# Patient Record
Sex: Female | Born: 1945 | Race: Black or African American | Hispanic: No | Smoking: Current every day smoker
Health system: Southern US, Community
[De-identification: ages and names within clinical notes are randomized; demographics above are authoritative.]

## PROBLEM LIST (undated history)

## (undated) DIAGNOSIS — G130 Paraneoplastic neuromyopathy and neuropathy: Secondary | ICD-10-CM

## (undated) DIAGNOSIS — J431 Panlobular emphysema: Secondary | ICD-10-CM

## (undated) DIAGNOSIS — C3490 Malignant neoplasm of unspecified part of unspecified bronchus or lung: Secondary | ICD-10-CM

## (undated) DIAGNOSIS — T7840XA Allergy, unspecified, initial encounter: Secondary | ICD-10-CM

## (undated) DIAGNOSIS — I10 Essential (primary) hypertension: Secondary | ICD-10-CM

## (undated) DIAGNOSIS — K219 Gastro-esophageal reflux disease without esophagitis: Secondary | ICD-10-CM

## (undated) DIAGNOSIS — G709 Myoneural disorder, unspecified: Secondary | ICD-10-CM

## (undated) DIAGNOSIS — D499 Neoplasm of unspecified behavior of unspecified site: Secondary | ICD-10-CM

---

## 2016-01-11 ENCOUNTER — Other Ambulatory Visit (HOSPITAL_COMMUNITY): Payer: Self-pay | Admitting: Internal Medicine

## 2016-01-11 DIAGNOSIS — R918 Other nonspecific abnormal finding of lung field: Secondary | ICD-10-CM

## 2016-01-13 ENCOUNTER — Ambulatory Visit (HOSPITAL_COMMUNITY): Payer: Medicare Other

## 2016-01-20 ENCOUNTER — Ambulatory Visit (HOSPITAL_COMMUNITY)
Admission: RE | Admit: 2016-01-20 | Discharge: 2016-01-20 | Disposition: A | Discharge status: Home / Self Care | Payer: Medicare Other | Source: Ambulatory Visit | Attending: Internal Medicine | Admitting: Internal Medicine

## 2016-01-20 DIAGNOSIS — R918 Other nonspecific abnormal finding of lung field: Secondary | ICD-10-CM

## 2016-01-20 DIAGNOSIS — R590 Localized enlarged lymph nodes: Secondary | ICD-10-CM | POA: Diagnosis not present

## 2016-01-20 DIAGNOSIS — C3411 Malignant neoplasm of upper lobe, right bronchus or lung: Secondary | ICD-10-CM | POA: Insufficient documentation

## 2016-01-20 LAB — GLUCOSE, CAPILLARY: Glucose-Capillary: 103 mg/dL — ABNORMAL HIGH (ref 65–99)

## 2016-01-20 MED ORDER — FLUDEOXYGLUCOSE F - 18 (FDG) INJECTION
6.7800 | Freq: Once | INTRAVENOUS | Status: AC | PRN
Start: 2016-01-20 — End: 2016-01-20
  Administered 2016-01-20: 6.78 via INTRAVENOUS

## 2016-02-10 ENCOUNTER — Encounter: Payer: Self-pay | Admitting: *Deleted

## 2016-02-10 NOTE — Progress Notes (Signed)
Oncology Nurse Navigator Documentation  Oncology Nurse Navigator Flowsheets 02/10/2016  Navigator Encounter Type Other  Acuity Level 2  Time Spent with Patient 30   I updated Shona Simpson, PA-C on updates from [employer]  on 02/09/16.  Patient receiving treatment at [facility] per care ever where.

## 2016-02-22 ENCOUNTER — Encounter: Payer: Self-pay | Admitting: Radiation Therapy

## 2016-02-22 ENCOUNTER — Other Ambulatory Visit: Payer: Self-pay | Admitting: Radiation Therapy

## 2016-02-22 DIAGNOSIS — C7949 Secondary malignant neoplasm of other parts of nervous system: Principal | ICD-10-CM

## 2016-02-22 DIAGNOSIS — C7931 Secondary malignant neoplasm of brain: Secondary | ICD-10-CM

## 2016-02-22 NOTE — Progress Notes (Signed)
1.  Do you need a wheel chair?    no  2. On oxygen? no  3. Have you ever had any surgery in the body part being scanned?  no  4. Have you ever had any surgery on your brain or heart?  no                                  5. Have you ever had surgery on your eyes or ears?   no                                6. Do you have a pacemaker or defibrillator?   no  7. Do you have a Neurostimulator?  no 8. Claustrophobic?  no  9. Any risk for metal in eyes?  no  10. Injury by bullet, buckshot, or shrapnel?  no  11. Stent?     no                                                                              12. Hx of Cancer?   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 Kidney or Liver disease?  no  14. Hx of Lupus, Rheumatoid Arthritis or Scleroderma?  no  15. IV Antibiotics or long term use of NSAIDS?  no  16. HX of Hypertension?  yes  17. Diabetes?  no  18. Allergy to contrast?  no  19. Recent labs.  Labs to be drawn at GI prior to her scan   Screening questions answered by pt over the phone on 02/22/16    Susan Boyles  R.T.(R)(T) Special Procedures Navigator [phone]     

## 2016-02-23 ENCOUNTER — Telehealth: Payer: Self-pay | Admitting: *Deleted

## 2016-02-23 NOTE — Telephone Encounter (Signed)
Called the [place] office, left vm on brittany RN phone requesting most recent labs of the patient BUN/CR levels, she is here Monday for IV start, and in Epic labs are out of date, please fax or get new orders from Dr. Lisbeth Renshaw for patient to get these done before Monday please.,  3:07 PM

## 2016-02-24 ENCOUNTER — Inpatient Hospital Stay: Admission: RE | Admit: 2016-02-24 | Payer: Medicare Other | Source: Ambulatory Visit

## 2016-02-27 ENCOUNTER — Ambulatory Visit: Admission: RE | Admit: 2016-02-27 | Payer: Medicare Other | Source: Ambulatory Visit | Admitting: Radiation Oncology

## 2016-02-27 ENCOUNTER — Ambulatory Visit
Admission: RE | Admit: 2016-02-27 | Discharge: 2016-02-27 | Disposition: A | Discharge status: Home / Self Care | Payer: Medicare Other | Source: Ambulatory Visit | Attending: Radiation Oncology | Admitting: Radiation Oncology

## 2016-02-27 ENCOUNTER — Other Ambulatory Visit: Payer: Self-pay | Admitting: Radiation Oncology

## 2016-02-27 ENCOUNTER — Encounter: Payer: Self-pay | Admitting: Radiation Oncology

## 2016-02-27 ENCOUNTER — Telehealth: Payer: Self-pay | Admitting: *Deleted

## 2016-02-27 VITALS — BP 119/64 | HR 81 | Temp 98.6°F | Resp 16

## 2016-02-27 DIAGNOSIS — Z01812 Encounter for preprocedural laboratory examination: Secondary | ICD-10-CM

## 2016-02-27 DIAGNOSIS — Z510 Encounter for antineoplastic radiation therapy: Secondary | ICD-10-CM | POA: Diagnosis present

## 2016-02-27 DIAGNOSIS — C7931 Secondary malignant neoplasm of brain: Secondary | ICD-10-CM | POA: Diagnosis present

## 2016-02-27 DIAGNOSIS — C801 Malignant (primary) neoplasm, unspecified: Secondary | ICD-10-CM | POA: Diagnosis not present

## 2016-02-27 DIAGNOSIS — C7949 Secondary malignant neoplasm of other parts of nervous system: Secondary | ICD-10-CM

## 2016-02-27 HISTORY — DX: Malignant neoplasm of unspecified part of unspecified bronchus or lung: C34.90

## 2016-02-27 HISTORY — DX: Paraneoplastic neuromyopathy and neuropathy: G13.0

## 2016-02-27 HISTORY — DX: Gastro-esophageal reflux disease without esophagitis: K21.9

## 2016-02-27 HISTORY — DX: Essential (primary) hypertension: I10

## 2016-02-27 HISTORY — DX: Myoneural disorder, unspecified: G70.9

## 2016-02-27 HISTORY — DX: Neoplasm of unspecified behavior of unspecified site: D49.9

## 2016-02-27 HISTORY — DX: Allergy, unspecified, initial encounter: T78.40XA

## 2016-02-27 HISTORY — DX: Panlobular emphysema: J43.1

## 2016-02-27 LAB — BASIC METABOLIC PANEL
Anion Gap: 13 mEq/L — ABNORMAL HIGH (ref 3–11)
BUN: 18.5 mg/dL (ref 7.0–26.0)
CHLORIDE: 99 meq/L (ref 98–109)
CO2: 25 meq/L (ref 22–29)
Calcium: 9.7 mg/dL (ref 8.4–10.4)
Creatinine: 0.8 mg/dL (ref 0.6–1.1)
EGFR: 89 mL/min/{1.73_m2} — AB (ref >90)
Glucose: 132 mg/dl (ref 70–140)
POTASSIUM: 4.5 meq/L (ref 3.5–5.1)
SODIUM: 137 meq/L (ref 136–145)

## 2016-02-27 MED ORDER — SODIUM CHLORIDE 0.9% FLUSH
10.0000 mL | Freq: Once | INTRAVENOUS | Status: AC
  Administered 2016-02-27: 10 mL via INTRAVENOUS

## 2016-02-27 MED ORDER — HEPARIN SOD (PORK) LOCK FLUSH 100 UNIT/ML IV SOLN
500.0000 [IU] | Freq: Once | INTRAVENOUS | Status: AC
  Administered 2016-02-27: 500 [IU] via INTRAVENOUS

## 2016-02-27 NOTE — Telephone Encounter (Signed)
Called the patient to come early and be here at noon for lab work since she didn't show Friday in [place], patient gave verbal understanding to be here at noon to check in for labs first before Radiation appt 0930am  Sister Dow Adolph called back confirming new time and gave explanation why patient needed to be here today at noon for lab we need results before can start IV  For Ct simulation 9:42 AM

## 2016-02-27 NOTE — Progress Notes (Addendum)
Does patient have an allergy to IV contrast dye?: NO   Has patient ever received premedication for IV contrast dye?: no  Does patient take metformin?: NO  If patient does take metformin when was the last dose: NO, not diabetic Date of lab work: 02/27/16 '@1122am'$  BUN: 18.5 CR: 0.8  IV site: iv locations: Right power port, placed emla cream over site 1255 will wait 20 minutes to access Patient gave name and dob as identification no c/o pain, coughs up sputum occasionally,radiation to lung today in [place] Accessed right power port using sterile technique,   98.6

## 2016-02-27 NOTE — Progress Notes (Signed)
Does patient have an allergy to IV contrast dye?: NO   Has patient ever received premedication for IV contrast dye?: NO  Does patient take metformin?: NO  If patient does take metformin when was the last dose: NO, not diabetic Date of lab work: 02/27/16 BUN: 18.5 CR: 0.8  IV site: iv locations: Right power port   de accessed patient power port right subclavian after flushing with 110m normal saline and 500/units/ml  Of heparin , still had excellent blood return, bandaid applied over site, patient tolerated well no c/o pain Waiting on Dr. SLauree Chandlerto see patient

## 2016-03-02 ENCOUNTER — Ambulatory Visit
Admission: RE | Admit: 2016-03-02 | Discharge: 2016-03-02 | Disposition: A | Discharge status: Home / Self Care | Payer: Medicare Other | Source: Ambulatory Visit | Attending: Radiation Oncology | Admitting: Radiation Oncology

## 2016-03-02 DIAGNOSIS — C7931 Secondary malignant neoplasm of brain: Secondary | ICD-10-CM

## 2016-03-02 DIAGNOSIS — C7949 Secondary malignant neoplasm of other parts of nervous system: Principal | ICD-10-CM

## 2016-03-02 DIAGNOSIS — Z510 Encounter for antineoplastic radiation therapy: Secondary | ICD-10-CM | POA: Diagnosis not present

## 2016-03-02 MED ORDER — GADOBENATE DIMEGLUMINE 529 MG/ML IV SOLN
10.0000 mL | Freq: Once | INTRAVENOUS | Status: AC | PRN
  Administered 2016-03-02: 10 mL via INTRAVENOUS

## 2016-03-07 ENCOUNTER — Encounter: Payer: Self-pay | Admitting: Radiation Oncology

## 2016-03-07 ENCOUNTER — Ambulatory Visit
Admission: RE | Admit: 2016-03-07 | Discharge: 2016-03-07 | Disposition: A | Discharge status: Home / Self Care | Payer: Medicare Other | Source: Ambulatory Visit | Attending: Radiation Oncology | Admitting: Radiation Oncology

## 2016-03-07 VITALS — BP 121/50 | HR 68 | Temp 98.0°F | Resp 16

## 2016-03-07 DIAGNOSIS — C7931 Secondary malignant neoplasm of brain: Secondary | ICD-10-CM | POA: Insufficient documentation

## 2016-03-07 DIAGNOSIS — Z510 Encounter for antineoplastic radiation therapy: Secondary | ICD-10-CM | POA: Diagnosis not present

## 2016-03-07 NOTE — Progress Notes (Signed)
S/p SRS Brain, patient i no c/o pain, no nausea, dizzy ness or vision changes, eating subway sub, continue to monitor patient 30 minutes , can be d/c home at 220pm, daughter at bedside, gave instructions no driving today , call for any unusual symptoms not normal for you once home, to rest and cll for any concerns, verbal understanding 2:07 PM

## 2016-03-07 NOTE — Progress Notes (Signed)
  Radiation Oncology         (336) [phone] ________________________________  Name: Vanessa Park MRN: [id number]  Date: 03/07/2016  DOB: 31-Mar-1946   SPECIAL TREATMENT PROCEDURE   3D TREATMENT PLANNING AND DOSIMETRY: The patient's radiation plan was reviewed and approved by Dr. Vertell Limber from neurosurgery and radiation oncology prior to treatment. It showed 3-dimensional radiation distributions overlaid onto the planning CT/MRI image set. The [facility] for the target structures as well as the organs at risk were reviewed. The documentation of the 3D plan and dosimetry are filed in the radiation oncology EMR.   NARRATIVE: The patient was brought to the TrueBeam stereotactic radiation treatment machine and placed supine on the CT couch. The head frame was applied, and the patient was set up for stereotactic radiosurgery. Neurosurgery was present for the set-up and delivery   SIMULATION VERIFICATION: In the couch zero-angle position, the patient underwent Exactrac imaging using the Brainlab system with orthogonal KV images. These were carefully aligned and repeated to confirm treatment position for each of the isocenters. The Exactrac snap film verification was repeated at each couch angle.   SPECIAL TREATMENT PROCEDURE: The patient received stereotactic radiosurgery to the following target:  PTV Rt Putamen 61m target was treated using 3 Arcs to a prescription dose of 20 Gy. ExacTrac Snap verification was performed for each couch angle.   STEREOTACTIC TREATMENT MANAGEMENT: Following delivery, the patient was transported to nursing in stable condition and monitored for possible acute effects. Vital signs were recorded . The patient tolerated treatment without significant acute effects, and was discharged to home in stable condition.  PLAN: Follow-up in one month.   ------------------------------------------------  JJodelle Gross MD, PhD

## 2016-03-07 NOTE — Progress Notes (Signed)
  Radiation Oncology         [phone] ________________________________  Name: Vanessa Park MRN: [id number]  Date: 02/27/2016  DOB: [birth year]  DIAGNOSIS:     ICD-9-CM ICD-10-CM   1. 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  Identity was confirmed.  All relevant records and images related to the planned course of therapy were reviewed.  The patient freely provided informed written consent to proceed with treatment after reviewing the details related to the planned course of therapy. The consent form was witnessed and verified by the simulation staff. Intravenous access was established for contrast administration. Then, the patient was set-up in a stable reproducible supine position for radiation therapy.  A relocatable thermoplastic stereotactic head frame was fabricated for precise immobilization.  CT images were obtained.  Surface markings were placed.  The CT images were loaded into the planning software and fused with the patient's targeting MRI scan.  Then the target and avoidance structures were contoured.  Treatment planning then occurred.  The radiation prescription was entered and confirmed.  I have requested 3D planning  I have requested a DVH of the following structures: Brain stem, brain, left eye, right eye, lenses, optic chiasm, target volumes, uninvolved brain, and normal tissue.    SPECIAL TREATMENT PROCEDURE:  The planned course of therapy using radiation constitutes a special treatment procedure. Special care is required in the management of this patient for the following reasons. This treatment constitutes a Special Treatment Procedure for the following reason: High dose per fraction requiring special monitoring for increased toxicities of treatment including daily imaging.  The special nature of the planned course of radiotherapy will require increased physician supervision and oversight to ensure patient's  safety with optimal treatment outcomes.  PLAN:  The patient will receive 20 Gy in 1 fraction.   ------------------------------------------------  Jodelle Gross, MD, PhD

## 2016-03-07 NOTE — Progress Notes (Signed)
  Radiation Oncology         (336) [phone] ________________________________  Name: Vanessa Park MRN: [id number]  Date: 03/07/2016  DOB: 14-Aug-1946  End of Treatment Note  Diagnosis:      ICD-9-CM ICD-10-CM   1. 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 ExacTrac Snap verification was performed for each couch angle.   Narrative: The patient tolerated radiation treatment well.   There were no signs of acute toxicity after treatment.  Plan: The patient has completed radiation treatment. The patient will return to radiation oncology clinic for routine followup in one month. I advised the patient to call or return sooner if they have any questions or concerns related to their recovery or treatment. ________________________________  ------------------------------------------------  JJodelle Gross MD, PhD

## 2016-03-13 NOTE — Op Note (Signed)
  Name: Vanessa Park  MRN: [id number]  Date: 03/02/2016   DOB: 1946/09/23  Stereotactic Radiosurgery Operative Note  PRE-OPERATIVE DIAGNOSIS:  Solitary Brain Metastasis  POST-OPERATIVE DIAGNOSIS:  Solitary Brain Metastasis  PROCEDURE:  Stereotactic Radiosurgery  SURGEON:  Peggyann Shoals, MD  NARRATIVE: The patient underwent a radiation treatment planning session in the radiation oncology simulation suite under the care of the radiation oncology physician and physicist.  I participated closely in the radiation treatment planning afterwards. The patient underwent planning CT which was fused to 3T high resolution MRI with 1 mm axial slices.  These images were fused on the planning system.  We contoured the gross target volumes and subsequently expanded this to yield the Planning Target Volume. I actively participated in the planning process.  I helped to define and review the target contours and also the contours of the optic pathway, eyes, brainstem and selected nearby organs at risk.  All the dose constraints for critical structures were reviewed and compared to AAPM Task Group 101.  The prescription dose conformity was reviewed.  I approved the plan electronically.    Accordingly, Vanessa Park was brought to the TrueBeam stereotactic radiation treatment linac and placed in the custom immobilization mask.  The patient was aligned according to the IR fiducial markers with BrainLab Exactrac, then orthogonal x-rays were used in ExacTrac with the 6DOF robotic table and the shifts were made to align the patient  Vanessa Park received stereotactic radiosurgery uneventfully.    The detailed description of the procedure is recorded in the radiation oncology procedure note.  I was present for the duration of the procedure.  DISPOSITION:  Following delivery, the patient was transported to nursing in stable condition and monitored for possible acute effects to be discharged to home in stable condition with  follow-up in one month.  Peggyann Shoals, MD 03/13/2016 12:59 PM

## 2016-07-20 ENCOUNTER — Encounter: Payer: Self-pay | Admitting: Radiation Therapy

## 2016-07-20 NOTE — Progress Notes (Signed)
Called to check in on patient. Her daughter answered the phone and  let me know that Jolan  passed away on 08/03/16.  Mont Dutton R.T.(R)(T)

## 2016-08-08 DEATH — deceased

## 2017-05-15 IMAGING — MR MR HEAD WO/W CM
11 series · 48 of 48 positions shown · IV contrast (multihance)
Comparison: 01/18/2016

CLINICAL DATA: Recently diagnosed lung cancer with right putamen
metastasis on prior routine brain MRI. SRS targeting.

EXAM:
MRI HEAD WITHOUT AND WITH CONTRAST
TECHNIQUE: Multiplanar, multiecho pulse sequences of the brain and surrounding
structures were obtained without and with intravenous contrast.
CONTRAST:  10mL MULTIHANCE GADOBENATE DIMEGLUMINE 529 MG/ML IV SOLN

[Series 2: FLAIR · sagittal · 3.0mm · 0.75mm/px · 3 of 40 slices shown (1 of 2)]
[im 1/40]
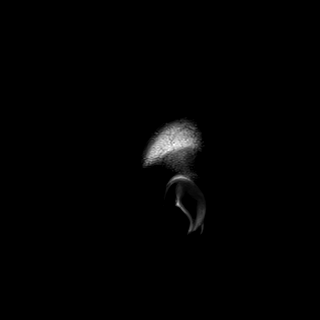
[im 20/40]
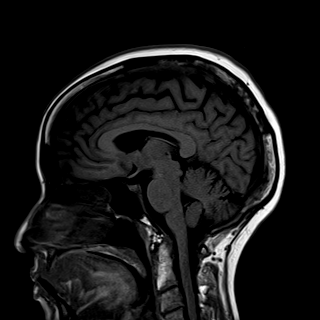
[im 40/40]
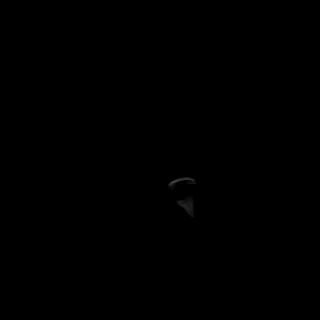

[Series 3: DWI · axial · 3.0mm · 1.50mm/px · z∈[-67,+81]mm · 5 of 78 slices shown (1 of 2)]
[im 1/78]
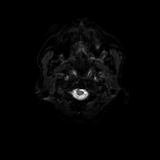
[im 20/78]
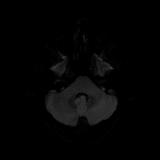
[im 39/78]
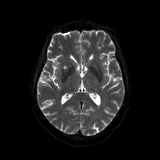
[im 58/78]
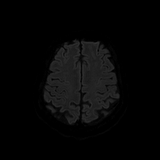
[im 78/78]
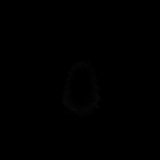

[Series 4: DWI · axial · 3.0mm · 1.50mm/px · z∈[-67,+81]mm · 3 of 38 slices shown (2 of 2)]
[im 1/38]
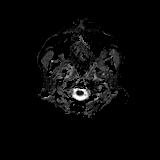
[im 19/38]
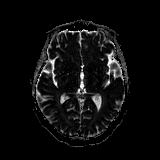
[im 38/38]
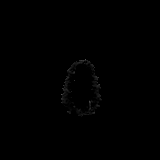

[Series 5: T2 · axial · 5.0mm · 0.57mm/px · z∈[-71,+85]mm · 2 of 27 slices shown]
[im 1/27]
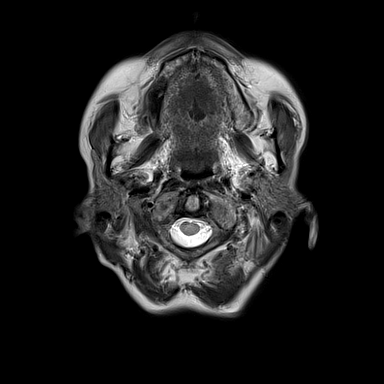
[im 27/27]
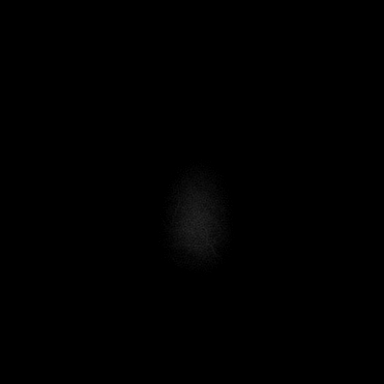

[Series 6: GRE · axial · 5.0mm · 0.57mm/px · z∈[-71,+85]mm · 2 of 27 slices shown]
[im 1/27]
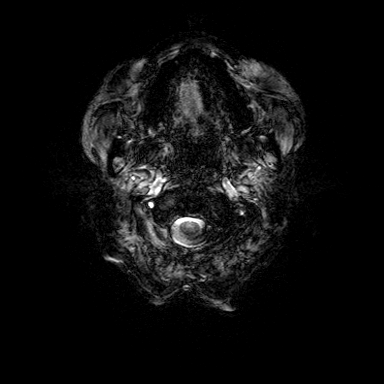
[im 27/27]
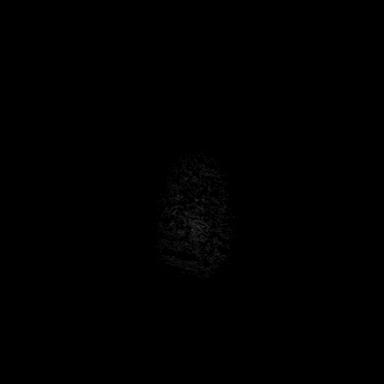

[Series 7: FLAIR · axial · 5.0mm · 0.57mm/px · z∈[-64,+92]mm · 2 of 27 slices shown (2 of 2)]
[im 1/27]
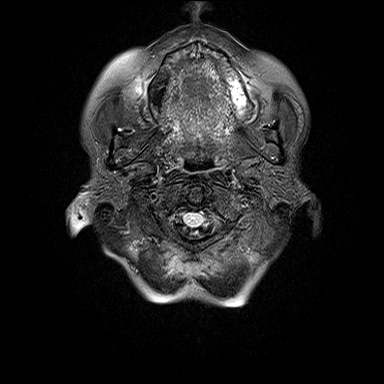
[im 27/27]
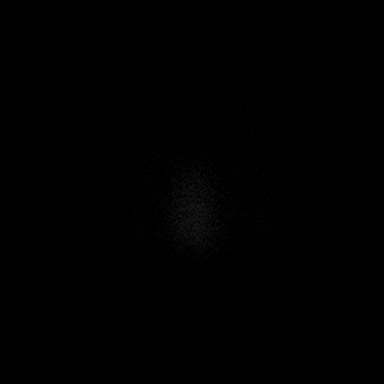

[Series 8: T1 · axial · 1.0mm · 0.75mm/px · z∈[-66,+92]mm · 11 of 159 slices shown]
[im 1/159]
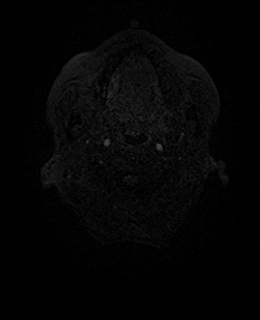
[im 16/159]
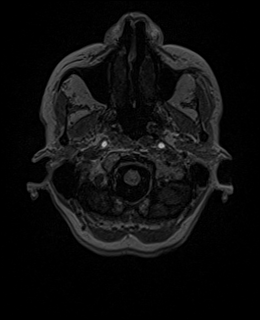
[im 32/159]
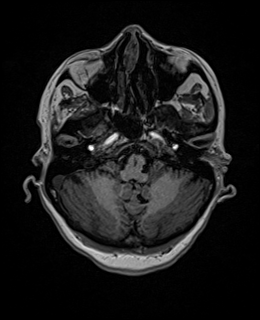
[im 48/159]
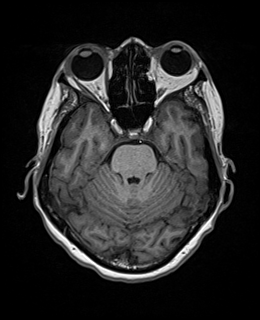
[im 64/159]
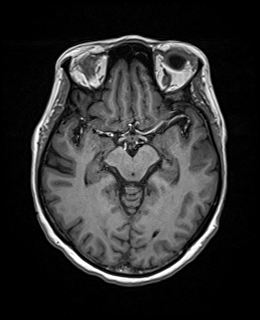
[im 80/159]
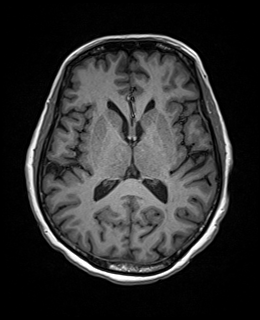
[im 95/159]
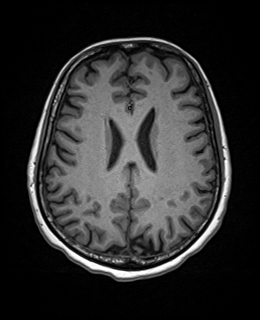
[im 111/159]
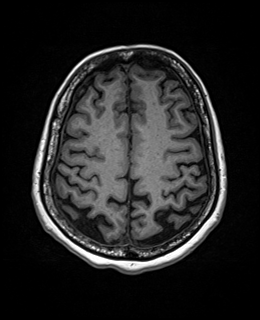
[im 127/159]
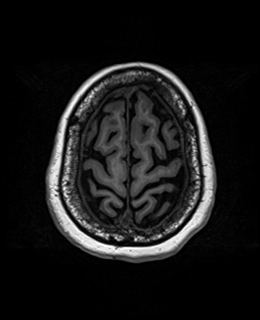
[im 143/159]
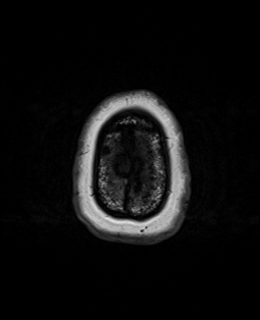
[im 159/159]
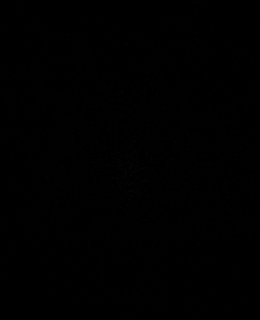

[Series 9: T2 post-contrast · coronal · 3.0mm · 0.57mm/px · 3 of 50 slices shown]
[im 1/50]
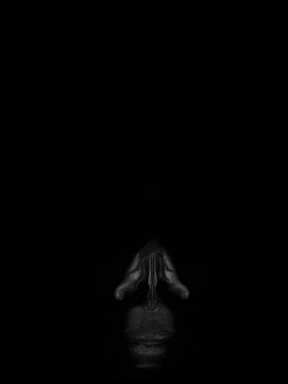
[im 25/50]
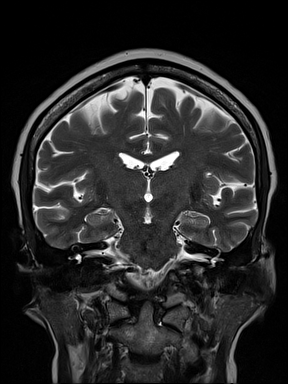
[im 50/50]
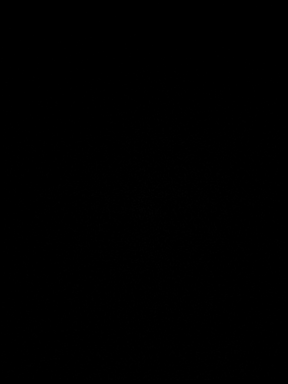

[Series 10: T1 post-contrast · axial · 1.0mm · 0.75mm/px · z∈[-66,+93]mm · 11 of 160 slices shown (1 of 2)]
[im 1/160]
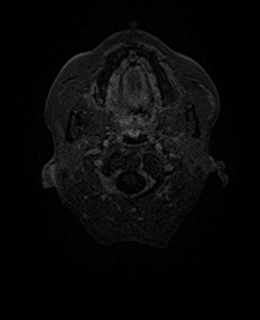
[im 16/160]
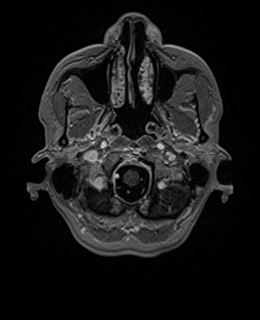
[im 32/160]
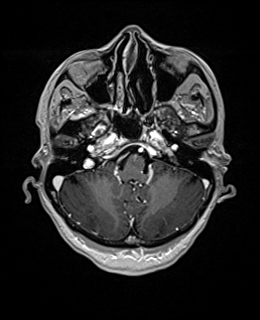
[im 48/160]
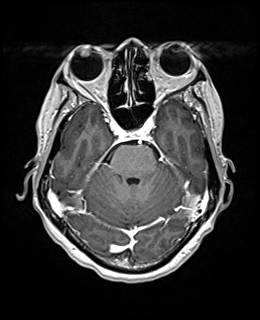
[im 64/160]
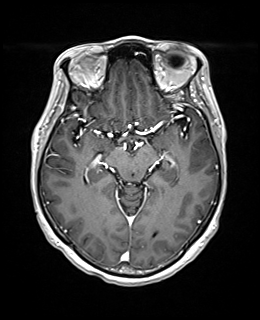
[im 80/160]
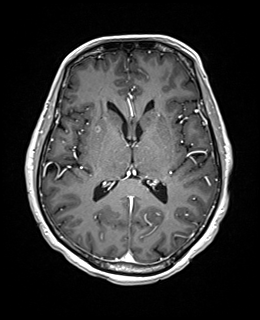
[im 96/160]
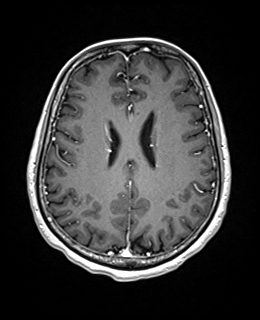
[im 112/160]
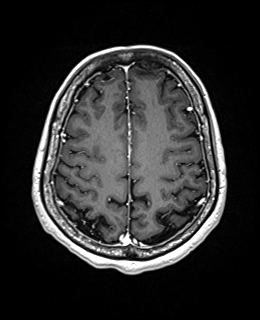
[im 128/160]
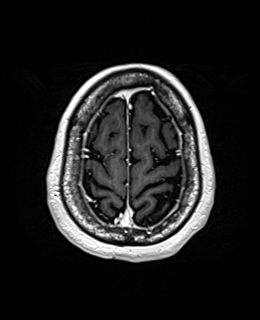
[im 144/160]
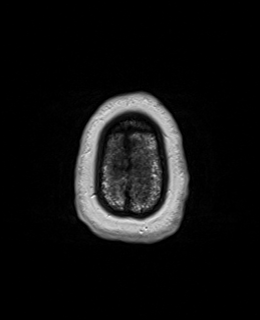
[im 160/160]
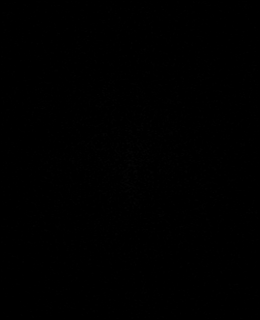

[Series 11: T1 post-contrast · coronal · 3.0mm · 0.57mm/px · 3 of 50 slices shown (2 of 2)]
[im 1/50]
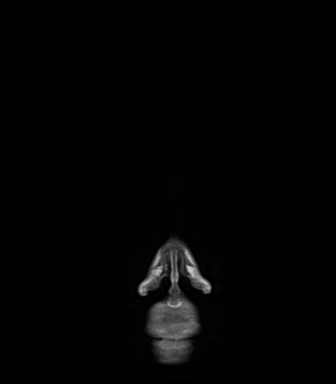
[im 25/50]
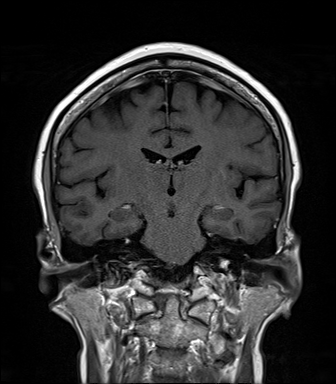
[im 50/50]
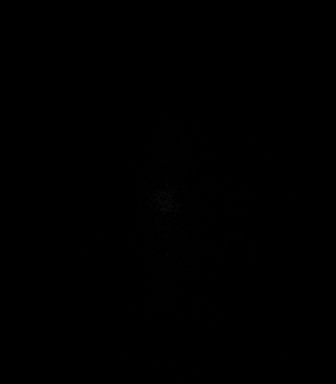

[Series 12: FLAIR post-contrast · sagittal · 3.0mm · 0.75mm/px · 3 of 39 slices shown]
[im 1/39]
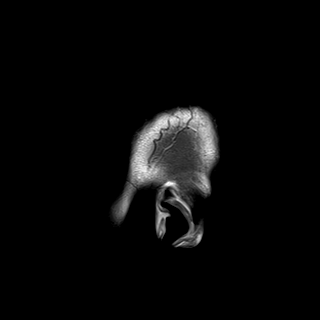
[im 20/39]
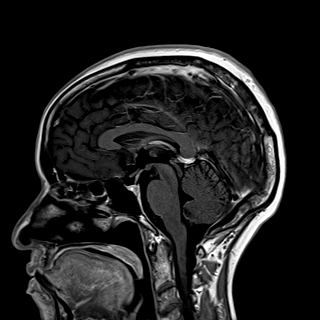
[im 39/39]
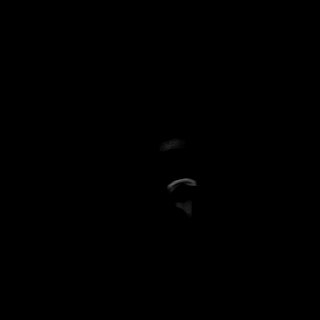

[48 of 48 positions shown; findings below may reference images not displayed]

FINDINGS: There is no evidence of acute infarct, intracranial hemorrhage,
midline shift, or extra-axial fluid collection. Ventricles and sulci
are normal in size. Small foci of T2 hyperintensity scattered
throughout the cerebral white matter are unchanged and nonspecific
but compatible with mild chronic small vessel ischemic disease.

There is a 7 mm ring-enhancing lesion in the right putamen (series
10, image 78, previously 5 mm). Minimal surrounding edema is noted.
No additional lesions are identified. A small developmental venous
anomaly is incidentally noted in the right frontal operculum.

Orbits are unremarkable. Paranasal sinuses and mastoid air cells are
clear. Major intracranial vascular flow voids are preserved, with
the right vertebral artery dominant. No suspicious skull lesions are
identified.
IMPRESSION: Slight enlargement of 7 mm metastasis in the right putamen. No
additional lesions identified.
